# Patient Record
Sex: Female | Born: 1990 | Hispanic: Yes | Marital: Single | Smoking: Never smoker
Health system: Southern US, Community
[De-identification: ages and names within clinical notes are randomized; demographics above are authoritative.]

## PROBLEM LIST (undated history)

## (undated) ENCOUNTER — Inpatient Hospital Stay (HOSPITAL_COMMUNITY): Payer: Self-pay

## (undated) HISTORY — PX: BREAST REDUCTION SURGERY: SHX8

---

## 2015-09-25 ENCOUNTER — Encounter (HOSPITAL_BASED_OUTPATIENT_CLINIC_OR_DEPARTMENT_OTHER): Payer: Self-pay | Admitting: *Deleted

## 2015-09-25 ENCOUNTER — Emergency Department (HOSPITAL_BASED_OUTPATIENT_CLINIC_OR_DEPARTMENT_OTHER)
Admission: EM | Admit: 2015-09-25 | Discharge: 2015-09-25 | Disposition: A | Discharge status: Home / Self Care | Payer: PRIVATE HEALTH INSURANCE | Attending: Emergency Medicine | Admitting: Emergency Medicine

## 2015-09-25 ENCOUNTER — Emergency Department (HOSPITAL_BASED_OUTPATIENT_CLINIC_OR_DEPARTMENT_OTHER): Payer: PRIVATE HEALTH INSURANCE

## 2015-09-25 DIAGNOSIS — R1030 Lower abdominal pain, unspecified: Secondary | ICD-10-CM | POA: Insufficient documentation

## 2015-09-25 DIAGNOSIS — O9989 Other specified diseases and conditions complicating pregnancy, childbirth and the puerperium: Secondary | ICD-10-CM | POA: Diagnosis present

## 2015-09-25 DIAGNOSIS — R109 Unspecified abdominal pain: Secondary | ICD-10-CM

## 2015-09-25 DIAGNOSIS — O26899 Other specified pregnancy related conditions, unspecified trimester: Secondary | ICD-10-CM

## 2015-09-25 DIAGNOSIS — Z3A01 Less than 8 weeks gestation of pregnancy: Secondary | ICD-10-CM | POA: Insufficient documentation

## 2015-09-25 LAB — URINALYSIS, ROUTINE W REFLEX MICROSCOPIC
Bilirubin Urine: NEGATIVE
Glucose, UA: NEGATIVE mg/dL
HGB URINE DIPSTICK: NEGATIVE
KETONES UR: NEGATIVE mg/dL
LEUKOCYTES UA: NEGATIVE
Nitrite: NEGATIVE
PROTEIN: NEGATIVE mg/dL
Specific Gravity, Urine: 1.021 (ref 1.005–1.030)
UROBILINOGEN UA: 0.2 mg/dL (ref 0.0–1.0)
pH: 7 (ref 5.0–8.0)

## 2015-09-25 LAB — WET PREP, GENITAL
TRICH WET PREP: NONE SEEN
Yeast Wet Prep HPF POC: NONE SEEN

## 2015-09-25 LAB — PREGNANCY, URINE: Preg Test, Ur: POSITIVE — AB

## 2015-09-25 LAB — HCG, QUANTITATIVE, PREGNANCY: hCG, Beta Chain, Quant, S: 4600 m[IU]/mL — ABNORMAL HIGH (ref <5)

## 2015-09-25 NOTE — ED Notes (Addendum)
Pt c/o of lower abdominal pain x1day. Pt is pregnant, states 6 weeks. States pain feels like pressure, comes and goes. Tender to touch. Denies bleeding.

## 2015-09-25 NOTE — Discharge Instructions (Signed)
You are 5 weeks and 1 day pregnant.  Please follow up with OBGYN in 10-14 days for a formal ultrasound.    Dolor abdominal en el embarazo (Abdominal Pain During Pregnancy) El dolor abdominal es frecuente durante el embarazo. Generalmente no causa ningn dao. El dolor abdominal puede tener numerosas causas. Algunas causas son ms graves que otras. Ciertas causas de dolor abdominal durante el embarazo se diagnostican fcilmente. A veces, se tarda un tiempo para llegar al diagnstico. Otras veces la causa no se conoce. El dolor abdominal puede estar relacionado con [place] alteracin del [place], o puede deberse a una causa totalmente diferente. Por este motivo, siempre consulte a su mdico cuando sienta molestias abdominales. INSTRUCCIONES PARA EL CUIDADO EN EL HOGAR  Est atenta al dolor para ver si hay cambios. Las siguientes indicaciones ayudarn a Psychologist, educational [employer] pueda sentir:  No Chiropodist sexuales y no coloque nada dentro de la vagina hasta que los sntomas hayan desaparecido completamente.  Descanse todo lo que pueda [employer] dolor se le haya calmado.  Si siente nuseas, beba lquidos claros. Evite los alimentos slidos mientras sienta malestar o tenga nuseas.  Tome slo medicamentos de venta libre o recetados, segn las indicaciones del mdico.  Cumpla con todas las visitas de control, segn le indique su mdico. SOLICITE ATENCIN MDICA DE INMEDIATO SI:  Tiene un sangrado, prdida de lquidos o elimina tejidos por la vagina.  El dolor o los clicos [place].  Tiene vmitos persistentes.  Comienza a Financial risk analyst al orinar u [employer].  Tiene fiebre.  Nota que los movimientos del beb disminuyen.  Siente intensa debilidad o se marea.  Tiene dificultad para respirar con o sin dolor abdominal.  Siente un dolor de cabeza intenso junto al dolor abdominal.  S[place]le [place] secrecin vaginal anormal con dolor abdominal.  Tiene diarrea  persistente.  El dolor abdominal sigue o empeora an despus de Field seismologist. ASEGRESE DE QUE:   Comprende estas instrucciones.  Controlar su afeccin.  Recibir ayuda de inmediato si no mejora o si empeora. Document Released: 12/15/2005 Document Revised: 10/05/2013 [facility] Patient Information 2015 [place], [place]. This information is not intended to replace advice given to you by your health care provider. Make sure you discuss any questions you have with your health care provider.

## 2015-09-25 NOTE — ED Notes (Addendum)
Abdominal cramps. States she is [redacted] weeks pregnant. Diarrhea yesterday.

## 2015-09-25 NOTE — ED Provider Notes (Signed)
CSN: [id number]     Arrival date & time 09/25/15  1535 History   First MD Initiated Contact with Patient 09/25/15 1603     Chief Complaint  Patient presents with  . Abdominal Pain     (Consider location/radiation/quality/duration/timing/severity/associated sxs/prior Treatment) HPI   24 year old G1P0 female presenting for evaluation of abdominal pain. patient believes that she is [redacted] weeks pregnant after having 3 positive pregnancy test at home. Her last menstrual period was 08/15/2015. She has not had a formal ultrasound to confirm her pregnancy. This morning while playing with a child in the gymnastics gym she developed mild crampy lower abdominal pain which lasted for approximately 1 hour and has resolved without any specific treatment. Rates her pain as 6 out of 10. She denies having any fever, chills, dizziness, lightheadedness, chest pain, difficulty breathing, back pain, dysuria, hematuria, vaginal bleeding, vaginal discharge, nausea vomiting diarrhea headache. She does have history of UTI and report mild odorous urine for the past few days. This is an unplanned pregnancy. She denies alcohol abuse or smoking.       History reviewed. No pertinent past medical history. History reviewed. No pertinent past surgical history. No family history on file. Social History  Substance Use Topics  . Smoking status: Never Smoker   . Smokeless tobacco: None  . Alcohol Use: No   OB History    Gravida Para Term Preterm AB TAB SAB Ectopic Multiple Living   1              Review of Systems  All other systems reviewed and are negative.      Allergies  Review of patient's allergies indicates no known allergies.  Home Medications   Prior to Admission medications   Not on File   BP 125/72 mmHg  Pulse 72  Temp(Src) 98.7 F (37.1 C) (Oral)  Resp 20  Wt 121 lb 4 oz (54.999 kg)  SpO2 100%  LMP 08/15/2015 Physical Exam  Constitutional: She appears well-developed and well-nourished. No  distress.  HENT:  Head: Atraumatic.  Eyes: Conjunctivae are normal.  Neck: Neck supple.  Cardiovascular: Normal rate and regular rhythm.   Pulmonary/Chest: Effort normal and breath sounds normal.  Abdominal: Soft. Bowel sounds are normal. She exhibits no distension. There is no tenderness.  Nongravid abdomen.  Genitourinary:  Chaperone present during exam.  No inguinal lymphadenopathy or inguinal hernia noted. Normal external genitalia. No discomfort with speculum insertion. Normal vaginal vault with mild functional white discharge. Close cervical os with mild friable tissue but no significant bleeding. On bimanual examination no adnexal tenderness or cervical motion tenderness. No mass noted.  Neurological: She is alert.  Skin: No rash noted.  Psychiatric: She has a normal mood and affect.  Nursing note and vitals reviewed.   ED Course  Procedures (including critical care time)  Patient presents for low abdominal pain. She is currently pregnant. Her pain started after strenuous activities. Pain is likely muscular skeletal. No significant discomfort on pelvic examination and abdomen is nontender on exam. Given her complaint, workup initiated including OB transvaginal ultrasound to confirm intrauterine pregnancy.  9:33 PM Ultrasound demonstrate an intrauterine pregnancy approximately 5 weeks and 1 day. It is recommended for patient to have a follow-up ultrasound in 10-14 days in which patient agrees. Otherwise her labs are reassuring. Return precautions discussed. Patient stable for discharge.  Labs Review Labs Reviewed  WET PREP, GENITAL - Abnormal; Notable for the following:    Clue Cells Wet Prep HPF POC FEW (*)  WBC, Wet Prep HPF POC FEW (*)    All other components within normal limits  PREGNANCY, URINE - Abnormal; Notable for the following:    Preg Test, Ur POSITIVE (*)    All other components within normal limits  HCG, QUANTITATIVE, PREGNANCY - Abnormal; Notable for the  following:    hCG, Beta Chain, Quant, S 4600 (*)    All other components within normal limits  URINALYSIS, ROUTINE W REFLEX MICROSCOPIC (NOT AT [facility])  RPR  HIV ANTIBODY (ROUTINE TESTING)  GC/CHLAMYDIA PROBE AMP ([facility]) NOT AT [facility]    Imaging Review US Ob Comp Less 14 Wks  09/25/2015   CLINICAL DATA:  Pelvic cramping  EXAM: OBSTETRIC <14 WK [place] AND TRANSVAGINAL OB US  TECHNIQUE: Both transabdominal and transvaginal ultrasound examinations were performed for complete evaluation of the gestation as well as the maternal uterus, adnexal regions, and pelvic cul-de-sac. Transvaginal technique was performed to assess early pregnancy.  COMPARISON:  None.  FINDINGS: Intrauterine gestational sac: Visualized/normal in shape.  Yolk sac:  Visualized  Embryo:  Not visualized  Cardiac Activity: Not visualized  MSD: 4  mm   5 w   1  d  Maternal uterus/adnexae: There is no demonstrable subchorionic hemorrhage. There is a uterine leiomyoma measuring 1.2 x 1.1 x 1.1 cm in the posterior fundal region. There is no subchorionic hemorrhage. Cervical os is closed. There is no extrauterine pelvic or adnexal mass. No free pelvic fluid.  IMPRESSION: Intrauterine gestational sac and yolk sac seen. Fetal pole not yet seen. Based on gestational sac size, estimated gestational age is 5 weeks. Given the current circumstance, follow-up ultrasound in 10-14 days advised. Small intrauterine leiomyoma. No extrauterine pelvic or adnexal mass. No free pelvic fluid.   Electronically Signed   By: Bretta Bang III M.D.   On: 09/25/2015 21:06   US Ob Transvaginal  09/25/2015   CLINICAL DATA:  Pelvic cramping  EXAM: OBSTETRIC <14 WK [place] AND TRANSVAGINAL OB US  TECHNIQUE: Both transabdominal and transvaginal ultrasound examinations were performed for complete evaluation of the gestation as well as the maternal uterus, adnexal regions, and pelvic cul-de-sac. Transvaginal technique was performed to assess early pregnancy.  COMPARISON:   None.  FINDINGS: Intrauterine gestational sac: Visualized/normal in shape.  Yolk sac:  Visualized  Embryo:  Not visualized  Cardiac Activity: Not visualized  MSD: 4  mm   5 w   1  d  Maternal uterus/adnexae: There is no demonstrable subchorionic hemorrhage. There is a uterine leiomyoma measuring 1.2 x 1.1 x 1.1 cm in the posterior fundal region. There is no subchorionic hemorrhage. Cervical os is closed. There is no extrauterine pelvic or adnexal mass. No free pelvic fluid.  IMPRESSION: Intrauterine gestational sac and yolk sac seen. Fetal pole not yet seen. Based on gestational sac size, estimated gestational age is 5 weeks. Given the current circumstance, follow-up ultrasound in 10-14 days advised. Small intrauterine leiomyoma. No extrauterine pelvic or adnexal mass. No free pelvic fluid.   Electronically Signed   By: Bretta Bang III M.D.   On: 09/25/2015 21:06   I have personally reviewed and evaluated these images and lab results as part of my medical decision-making.   EKG Interpretation None      MDM   Final diagnoses:  Abdominal pain in pregnancy    BP 114/75 mmHg  Pulse 69  Temp(Src) 98.7 F (37.1 C) (Oral)  Resp 18  Wt 121 lb 4 oz (54.999 kg)  SpO2 100%  LMP 08/15/2015  Fayrene Helper, PA-C 09/25/15 2134  Lavera Guise, MD 09/26/15 [phone]

## 2015-09-26 LAB — HIV ANTIBODY (ROUTINE TESTING W REFLEX): HIV SCREEN 4TH GENERATION: NONREACTIVE

## 2015-09-26 LAB — GC/CHLAMYDIA PROBE AMP (~~LOC~~) NOT AT ARMC
Chlamydia: NEGATIVE
Neisseria Gonorrhea: NEGATIVE

## 2015-09-26 LAB — SYPHILIS: RPR W/REFLEX TO RPR TITER AND TREPONEMAL ANTIBODIES, TRADITIONAL SCREENING AND DIAGNOSIS ALGORITHM: RPR Ser Ql: NONREACTIVE

## 2015-10-02 ENCOUNTER — Inpatient Hospital Stay (HOSPITAL_COMMUNITY): Payer: PRIVATE HEALTH INSURANCE

## 2015-10-02 ENCOUNTER — Encounter (HOSPITAL_COMMUNITY): Payer: Self-pay | Admitting: *Deleted

## 2015-10-02 ENCOUNTER — Inpatient Hospital Stay (HOSPITAL_COMMUNITY)
Admission: AD | Admit: 2015-10-02 | Discharge: 2015-10-02 | Disposition: A | Discharge status: Home / Self Care | Payer: PRIVATE HEALTH INSURANCE | Source: Ambulatory Visit | Attending: Obstetrics & Gynecology | Admitting: Obstetrics & Gynecology

## 2015-10-02 DIAGNOSIS — Z3A01 Less than 8 weeks gestation of pregnancy: Secondary | ICD-10-CM | POA: Insufficient documentation

## 2015-10-02 DIAGNOSIS — R109 Unspecified abdominal pain: Secondary | ICD-10-CM | POA: Diagnosis present

## 2015-10-02 DIAGNOSIS — O209 Hemorrhage in early pregnancy, unspecified: Secondary | ICD-10-CM | POA: Diagnosis not present

## 2015-10-02 DIAGNOSIS — O034 Incomplete spontaneous abortion without complication: Secondary | ICD-10-CM

## 2015-10-02 DIAGNOSIS — N939 Abnormal uterine and vaginal bleeding, unspecified: Secondary | ICD-10-CM | POA: Diagnosis present

## 2015-10-02 LAB — CBC
HEMATOCRIT: 37.3 % (ref 36.0–46.0)
Hemoglobin: 12.6 g/dL (ref 12.0–15.0)
MCH: 30 pg (ref 26.0–34.0)
MCHC: 33.8 g/dL (ref 30.0–36.0)
MCV: 88.8 fL (ref 78.0–100.0)
Platelets: 335 10*3/uL (ref 150–400)
RBC: 4.2 MIL/uL (ref 3.87–5.11)
RDW: 12.4 % (ref 11.5–15.5)
WBC: 9.9 10*3/uL (ref 4.0–10.5)

## 2015-10-02 LAB — URINALYSIS, ROUTINE W REFLEX MICROSCOPIC
Bilirubin Urine: NEGATIVE
Glucose, UA: NEGATIVE mg/dL
Ketones, ur: NEGATIVE mg/dL
LEUKOCYTES UA: NEGATIVE
Nitrite: NEGATIVE
PROTEIN: NEGATIVE mg/dL
Specific Gravity, Urine: 1.02 (ref 1.005–1.030)
Urobilinogen, UA: 0.2 mg/dL (ref 0.0–1.0)
pH: 7.5 (ref 5.0–8.0)

## 2015-10-02 LAB — URINE MICROSCOPIC-ADD ON

## 2015-10-02 LAB — HCG, QUANTITATIVE, PREGNANCY: hCG, Beta Chain, Quant, S: 3417 m[IU]/mL — ABNORMAL HIGH (ref <5)

## 2015-10-02 LAB — ABO/RH: ABO/RH(D): O POS

## 2015-10-02 MED ORDER — MISOPROSTOL 200 MCG PO TABS
800.0000 ug | ORAL_TABLET | Freq: Once | ORAL | Status: AC
  Administered 2015-10-02: 800 ug via VAGINAL
  Filled 2015-10-02: qty 4

## 2015-10-02 MED ORDER — OXYCODONE-ACETAMINOPHEN 5-325 MG PO TABS
1.0000 | ORAL_TABLET | Freq: Once | ORAL | Status: AC
  Administered 2015-10-02: 1 via ORAL
  Filled 2015-10-02: qty 1

## 2015-10-02 MED ORDER — PROMETHAZINE HCL 25 MG PO TABS: 25.0000 mg | ORAL_TABLET | Freq: Four times a day (QID) | ORAL | Status: AC | PRN

## 2015-10-02 MED ORDER — OXYCODONE-ACETAMINOPHEN 5-325 MG PO TABS: 1.0000 | ORAL_TABLET | ORAL | Status: AC | PRN

## 2015-10-02 NOTE — Progress Notes (Signed)
Written and verbal d/c instructions given and understanding voiced. FOB is coming to get pt. Pt alittle dizzy from Percocet so will remain in room until he arrives

## 2015-10-02 NOTE — MAU Provider Note (Signed)
Chief Complaint: Vaginal Bleeding and Abdominal Pain  First Provider Initiated Contact with Patient 10/02/2015 at 1235.  SUBJECTIVE HPI: Nichole Yang is a 24 y.o. G1P0 at [redacted]w[redacted]d by LMP who presents to [facility] reporting vaginal bleeding as heavy as a period and low abdominal cramping since this morning. Passing tiny clots. Was seen in ED 09/25/2015 for low abdominal pain in pregnancy. Ultrasound showed gestational sac with yolk sac, but no fetal pole. Quantitative hCG was 4600. Pelvic exam done.  Location: Suprapubic area Quality: Cramping Severity: 7/10 on pain scale Duration: 4 hours Context: none Timing: intermittent Modifying factors: none. Hasn't tried anything for pain Associated signs and symptoms: vaginal bleeding.   History reviewed. No pertinent past medical history. OB History  Gravida Para Term Preterm AB SAB TAB Ectopic Multiple Living  1             # Outcome Date GA Lbr Len/2nd Weight Sex Delivery Anes PTL Lv  1 Current              Past Surgical History  Procedure Laterality Date  . Breast reduction surgery     Social History   Social History  . Marital Status: Single    Spouse Name: N/A  . Number of Children: N/A  . Years of Education: N/A   Occupational History  . Not on file.   Social History Main Topics  . Smoking status: Never Smoker   . Smokeless tobacco: Not on file  . Alcohol Use: No  . Drug Use: No  . Sexual Activity: Yes   Other Topics Concern  . Not on file   Social History Narrative   No current facility-administered medications on file prior to encounter.   No current outpatient prescriptions on file prior to encounter.   No Known Allergies  I have reviewed the past Medical Hx, Surgical Hx, Social Hx, Allergies and Medications.   Review of Systems  Constitutional: Negative for fever and chills.  Gastrointestinal: Positive for abdominal pain. Negative for nausea, vomiting, diarrhea and constipation.   Genitourinary: Positive for vaginal bleeding. Negative for dysuria, urgency, frequency, hematuria, flank pain and vaginal discharge.  Musculoskeletal: Negative for back pain.  Neurological: Negative for dizziness.    OBJECTIVE Patient Vitals for the past 24 hrs:  BP Temp Temp src Pulse Resp  10/02/15 1449 119/69 mmHg 98.3 F (36.8 C) Oral 87 18  10/02/15 1228 118/73 mmHg 98.7 F (37.1 C) - 81 18   Constitutional: Well-developed, well-nourished female in no acute distress.  Cardiovascular: normal rate Respiratory: normal rate and effort.  GI: Abd soft, non-tender, gravid appropriate for gestational age. Pos BS x 4 MS: Extremities nontender, no edema, normal ROM Neurologic: Alert and oriented x 4.  GU: Neg CVAT.  SPECULUM EXAM: Declined due to recent exam. Small -mod amount of BRB on pad.   LAB RESULTS Results for orders placed or performed during the hospital encounter of 10/02/15 (from the past 24 hour(s))  Urinalysis, Routine w reflex microscopic (not at [facility])     Status: Abnormal   Collection Time: 10/02/15 12:20 PM  Result Value Ref Range   Color, Urine YELLOW YELLOW   APPearance CLEAR CLEAR   Specific Gravity, Urine 1.020 1.005 - 1.030   pH 7.5 5.0 - 8.0   Glucose, UA NEGATIVE NEGATIVE mg/dL   Hgb urine dipstick LARGE (A) NEGATIVE   Bilirubin Urine NEGATIVE NEGATIVE   Ketones, ur NEGATIVE NEGATIVE mg/dL   Protein, ur NEGATIVE NEGATIVE mg/dL   Urobilinogen,  UA 0.2 0.0 - 1.0 mg/dL   Nitrite NEGATIVE NEGATIVE   Leukocytes, UA NEGATIVE NEGATIVE  Urine microscopic-add on     Status: None   Collection Time: 10/02/15 12:20 PM  Result Value Ref Range   Squamous Epithelial / LPF RARE RARE   RBC / HPF 0-2 <3 RBC/hpf   Bacteria, UA RARE RARE  ABO/Rh     Status: None   Collection Time: 10/02/15  1:14 PM  Result Value Ref Range   ABO/RH(D) O POS   CBC     Status: None   Collection Time: 10/02/15  1:14 PM  Result Value Ref Range   WBC 9.9 4.0 - 10.5 K/uL   RBC 4.20 3.87 -  5.11 MIL/uL   Hemoglobin 12.6 12.0 - 15.0 g/dL   HCT 16.1 09.6 - 04.5 %   MCV 88.8 78.0 - 100.0 fL   MCH 30.0 26.0 - 34.0 pg   MCHC 33.8 30.0 - 36.0 g/dL   RDW 40.9 81.1 - 91.4 %   Platelets 335 150 - 400 K/uL  hCG, quantitative, pregnancy     Status: Abnormal   Collection Time: 10/02/15  1:14 PM  Result Value Ref Range   hCG, Beta Chain, Quant, S 3417 (H) <5 mIU/mL    IMAGING US Ob Comp Less 14 Wks  09/25/2015   CLINICAL DATA:  Pelvic cramping  EXAM: OBSTETRIC <14 WK [place] AND TRANSVAGINAL OB US  TECHNIQUE: Both transabdominal and transvaginal ultrasound examinations were performed for complete evaluation of the gestation as well as the maternal uterus, adnexal regions, and pelvic cul-de-sac. Transvaginal technique was performed to assess early pregnancy.  COMPARISON:  None.  FINDINGS: Intrauterine gestational sac: Visualized/normal in shape.  Yolk sac:  Visualized  Embryo:  Not visualized  Cardiac Activity: Not visualized  MSD: 4  mm   5 w   1  d  Maternal uterus/adnexae: There is no demonstrable subchorionic hemorrhage. There is a uterine leiomyoma measuring 1.2 x 1.1 x 1.1 cm in the posterior fundal region. There is no subchorionic hemorrhage. Cervical os is closed. There is no extrauterine pelvic or adnexal mass. No free pelvic fluid.  IMPRESSION: Intrauterine gestational sac and yolk sac seen. Fetal pole not yet seen. Based on gestational sac size, estimated gestational age is 5 weeks. Given the current circumstance, follow-up ultrasound in 10-14 days advised. Small intrauterine leiomyoma. No extrauterine pelvic or adnexal mass. No free pelvic fluid.   Electronically Signed   By: Bretta Bang III M.D.   On: 09/25/2015 21:06   US Ob Transvaginal  10/02/2015   CLINICAL DATA:  Bleeding. Pending quantitative beta HCG. LMP 08/15/2015. EDC by LMP is 05/21/2016. By LMP patient is 6 weeks 6 days. Followup.  EXAM: TRANSVAGINAL OB ULTRASOUND  TECHNIQUE: Transvaginal ultrasound was performed for  complete evaluation of the gestation as well as the maternal uterus, adnexal regions, and pelvic cul-de-sac.  COMPARISON:  09/25/2015  FINDINGS: Intrauterine gestational sac: Not seen  Yolk sac:  Not seen  Embryo:  Not seen  Cardiac Activity: Not seen  Maternal uterus/adnexae: The ovaries have a normal appearance. Endometrial stripe is slightly heterogeneous. There is focal thickening of the endometrium in the mid uterine body, measuring 1.2 cm. No free pelvic fluid. Small fundal fibroid is 1.2 x 0.9 x 0.9 cm.  IMPRESSION: 1. No intrauterine gestational sac identified at this time. 2. Focally thickened and heterogeneous endometrium suggests possible retained products of conception.   Electronically Signed   By: Norva Pavlov M.D.  On: 10/02/2015 14:02   US Ob Transvaginal  09/25/2015   CLINICAL DATA:  Pelvic cramping  EXAM: OBSTETRIC <14 WK [place] AND TRANSVAGINAL OB US  TECHNIQUE: Both transabdominal and transvaginal ultrasound examinations were performed for complete evaluation of the gestation as well as the maternal uterus, adnexal regions, and pelvic cul-de-sac. Transvaginal technique was performed to assess early pregnancy.  COMPARISON:  None.  FINDINGS: Intrauterine gestational sac: Visualized/normal in shape.  Yolk sac:  Visualized  Embryo:  Not visualized  Cardiac Activity: Not visualized  MSD: 4  mm   5 w   1  d  Maternal uterus/adnexae: There is no demonstrable subchorionic hemorrhage. There is a uterine leiomyoma measuring 1.2 x 1.1 x 1.1 cm in the posterior fundal region. There is no subchorionic hemorrhage. Cervical os is closed. There is no extrauterine pelvic or adnexal mass. No free pelvic fluid.  IMPRESSION: Intrauterine gestational sac and yolk sac seen. Fetal pole not yet seen. Based on gestational sac size, estimated gestational age is 5 weeks. Given the current circumstance, follow-up ultrasound in 10-14 days advised. Small intrauterine leiomyoma. No extrauterine pelvic or adnexal mass. No  free pelvic fluid.   Electronically Signed   By: Bretta Bang III M.D.   On: 09/25/2015 21:06    MAU COURSE [place], Quant. CBC, ABO/Rh  Passed small amount of tissue. Bleeding stable.   Discussed [place] and Quant Dx incomplete AB. Recommend expectant management. Pt has flight scheduled for 10/07/15 back home to [place]. Wonders if it is safe to travel. CNM expressed concern about possibility of hemorrhage or infection during travel if all tissue has not passed. Offered Cytotec. Patient gives consent.       Early Intrauterine Pregnancy Failure  _x__  Documented intrauterine pregnancy failure less than or equal to [redacted] weeks gestation  _x__  No serious current illness  _x__  Baseline Hgb greater than or equal to 10g/dl  _x__  Patient has easily accessible transportation to the hospital  _x__  Clear preference  _x__  Practitioner/physician deems patient reliable  _x__  Counseling by practitioner or physician  _x__  Patient education by RN  _NA_  Consent form signed  _NA_  Rho-Gam given by RN if indicated  _x__ Medication dispensed   _x__   Cytotec 800 mcg  __   Intravaginally by patient at home         _x_   Intravaginally by RN in MAU        __   Rectally by patient at home        __   Rectally by RN in MAU  ___  Ibuprofen 600 mg 1 tablet by mouth every 6 hours as needed #30  _x__  Oxycodone/acetaminophen 5/325 mg by mouth every 4 to 6 hours as needed  _x__  Phenergan 12.5 mg by mouth every 4 hours as needed for nausea   MDM  24 year old female 6 weeks 1 day by LMP diagnosed with incomplete AB. Bleeding stable. Cytotec given in MAU since patient has to travel in 5 days.   ASSESSMENT 1. Incomplete miscarriage   2. Vaginal bleeding in pregnancy, first trimester    PLAN Discharge home in stable condition. Bleeding Precautions Support given. Offered chaplain.  Work note given. Release of information signed and records given for patient to have during  travel. Follow-up Information    Follow up with [facility] [place] [facility]ERN[facility] ADMISS[facility].   Why:  As needed in emergencies (heavy bleeding, severe pain or fever  greater than 100.4)   Contact information:   [address] [id number] mc [place] [address] [phone]       Medication List    TAKE these medications        oxyCODONE-acetaminophen 5-325 MG tablet  Commonly known as:  PERCOCET/ROXICET  Take 1-2 tablets by mouth every 4 (four) hours as needed.     prenatal multivitamin Tabs tablet  Take 1 tablet by mouth daily at 12 noon.     promethazine 25 MG tablet  Commonly known as:  PHENERGAN  Take 1 tablet (25 mg total) by mouth every 6 (six) hours as needed for nausea or vomiting.       [place], CNM 10/02/2015  4:11 PM

## 2015-10-02 NOTE — MAU Note (Addendum)
Saw vag bleeding and cramping since 0930. Saw alittle blood last night. Bright blood. No recent intercourse. Had diarrhea this am and then saw blood

## 2015-10-02 NOTE — Progress Notes (Signed)
Nichole Yang CNM discussed test results and d/c plan with pt in length. Pt signed release and has lab and u/s results to have with her while traveling to [place]. Written and verbal d/c instructions given and understanding voiced.

## 2015-10-02 NOTE — Discharge Instructions (Signed)
Aborto incompleto °(Incomplete Miscarriage) °Un aborto espontáneo es la pérdida repentina de un bebé en gestación (feto) antes de la semana 20 del embarazo. En un aborto espontáneo, partes del feto o la placenta (alumbramiento) permanecen en el cuerpo.  °El aborto espontáneo puede ser una experiencia que afecte emocionalmente a la persona. Hable con su médico si tiene preguntas sobre el aborto espontáneo, el proceso de duelo y los planes futuros de embarazo. °CAUSAS  °· Algunos problemas cromosómicos pueden hacer imposible que el bebé se desarrolle normalmente. Los problemas con los genes o cromosomas del bebé son, en la mayoría de los casos, el resultado de errores que se producen, al azar, cuando el embrión se divide y crece. Estos problemas no se heredan de los padres. °· Infección en el cuello del útero. °· Problemas hormonales. °· Problemas en el cuello del útero, como tener un útero incompetente. Esto ocurre cuando los tejidos no son lo suficientemente fuertes como para contener el embarazo. °· Problemas del útero, como un útero con forma anormal, los fibromas o anormalidades congénitas. °· Ciertas enfermedades crónicas. °· No fume, no beba alcohol, ni consuma drogas. °· Traumatismos. °SÍNTOMAS  °· Sangrado o manchado vaginal, con o sin cólicos o dolor. °· Dolor o cólicos en el abdomen o en la cintura. °· Eliminación de líquido, tejidos o coágulos grandes por la vagina. °DIAGNÓSTICO  °El médico le hará un examen físico. También le indicará una ecografía para confirmar el aborto. Es posible que se realicen análisis de sangre. °TRATAMIENTO  °· Generalmente se realiza un procedimiento de dilatación y curetaje (D y C). Durante el procedimiento de dilatación y curetaje, el cuello del útero se abre (dilata) y se retira todo resto de tejido fetal o placentario del útero. °· Si hay una infección, le recetarán antibióticos. Posiblemente le receten otros medicamentos para reducir (contraer) el tamaño del útero si hay  mucha hemorragia. °· Si su tipo de sangre es Rh negativo y el del bebé es Rh positivo, necesitará una inyección de inmunoglobulina Rho(D). Esta inyección protegerá a los futuros bebés de tener problemas de compatibilidad Rh en futuros embarazos. °· Probablemente le indiquen reposo. Esto significa que debe quedarse en cama y levantarse únicamente para ir al baño. °INSTRUCCIONES PARA EL CUIDADO EN EL HOGAR  °· Haga reposo según las indicaciones del médico. °· Limite las actividades según las indicaciones del médico. Es posible que se le permita retomar las actividades livianas si no se le realizó un curetaje, pero necesitará tratamiento adicional. °· Lleve un registro de la cantidad de toallas sanitarias que usa por día. Observe cuán impregnadas (saturadas) están. Registre esta información. °· No  use tampones. °· No se haga duchas vaginales ni tenga relaciones sexuales hasta que el médico la autorice. °· Asista a todas las citas de seguimiento para una nueva evaluación y para continuar el tratamiento. °· Sólo tome medicamentos de venta libre o recetados para calmar el dolor, el malestar o bajar la fiebre, según las indicaciones de su médico. °· Tome los antibióticos como le indicó el médico. Asegúrese de que finaliza la prescripción completa aunque se sienta mejor. °SOLICITE ATENCIÓN MÉDICA DE INMEDIATO SI:  °· Siente calambres intensos en el estómago, en la espalda o en el abdomen. °· Le sube la fiebre sin motivo (asegúrese de registrar las cifras). °· Elimina coágulos grandes o tejidos (consérvelos para que el médico los analice). °· La hemorragia aumenta. °· Se siente mareada, débil o tiene episodios de desmayo. °ASEGÚRESE DE QUE:  °· Comprende estas instrucciones. °·   Controlar su afeccin.  Recibir ayuda de inmediato si no mejora o si empeora. Document Released: 12/15/2005 Document Revised: 10/05/2013 [facility] Patient Information 2015 [place] Manor[place]Care, [place]. This information is not intended to replace advice given  to you by your health care provider. Make sure you discuss any questions you have with your health care provider.  FACTS YOU SHOULD KNOW  WHAT IS AN EARLY PREGNANCY FAILURE? Once the egg is fertilized with the sperm and begins to develop, it attaches to the lining of the uterus. This early pregnancy tissue may not develop into an embryo (the beginning stage of a baby). Sometimes an embryo does develop but does not continue to grow. These problems can be seen on ultrasound.   MANAGEMNT OF EARLY PREGNANCY FAILURE: About 4 out of 100 (0.25%) women will have a pregnancy loss in her lifetime.  One in five pregnancies is found to be an early pregnancy failure.  There are 3 ways to care for an early pregnancy failure:   (1) Surgery, (2) Medicine, (3) Waiting for you to pass the pregnancy on your own. The decision as to how to proceed after being diagnosed with and early pregnancy failure is an individual one.  The decision can be made only after appropriate counseling.  You need to weigh the pros and cons of the 3 choices. Then you can make the choice that works for you. SURGERY (D&E)  Procedure over in 1 day  Requires being put to sleep  Bleeding may be light  Possible problems during surgery, including injury to womb(uterus)  Care provider has more control Medicine (CYTOTEC)  The complete procedure may take days to weeks  No Surgery  Bleeding may be heavy at times  There may be drug side effects  Patient has more control Waiting  You may choose to wait, in which case your own body may complete the passing of the abnormal early pregnancy on its own in about 2-4 weeks  Your bleeding may be heavy at times  There is a small possibility that you may need surgery if the bleeding is too much or not all of the pregnancy has passed. CYTOTEC MANAGEMENT Prostaglandins (cytotec) are the most widely used drug for this purpose. They cause the uterus to cramp and contract. You will place the  medicine yourself inside your vagina in the privacy of your home. Empting of the uterus should occur within 3 days but the process may continue for several weeks. The bleeding may seem heavy at times. POSSIBLE SIDE EFFECTS FROM CYTOTEC  Nausea   Vomiting  Diarrhea Fever  Chills  Hot Flashes Side effects  from the process of the early pregnancy failure include:  Cramping  Bleeding  Headaches  Dizziness RISKS: This is a low risk procedure. Less than 1 in 100 women has a complication. An incomplete passage of the early pregnancy may occur. Also, Hemorrhage (heavy bleeding) could happen.  Rarely the pregnancy will not be passed completely. Excessively heavy bleeding may occur.  Your doctor may need to perform surgery to empty the uterus (D&E). Afterwards: Everybody will feel differently after the early pregnancy completion. You may have soreness or cramps for a day or two. You may have soreness or cramps for day or two.  You may have light bleeding for up to 2 weeks. You may be as active as you feel like being. If you have any of the following problems you may call Maternity Admissions Unit at [phone].  If you have pain that does not  get better  with pain medication  Bleeding that soaks through 2 thick full-sized sanitary pads in an hour  Cramps that last longer than 2 days  Foul smelling discharge  Fever above 100.4 degrees F Even if you do not have any of these symptoms, you should have a follow-up exam to make sure you are healing properly. This appointment will be made for you before you leave the hospital. Your next normal period will start again in 4-6 week after the loss. You can get pregnant soon after the loss, so use birth control right away. Finally: Make sure all your questions are answered before during and after any procedure. Follow up with medical care and family planning methods.

## 2015-10-02 NOTE — MAU Note (Signed)
Offered pt inhouse spanish interpreter but she states she does not need her but will ask if wants one

## 2015-10-03 NOTE — Progress Notes (Signed)
Met with Ms. Nichole Yang a few hours after her loss. Her SO was bedside.  She stated how she felt fine this morning, was pregnant and now she is not. She said she just did not understand. She told me of her background and work assignment in [place] that was conditional that she not get pregnant. She expressed her concern for her stress (as a possible cause for losing her baby). She also said she argued with him (speaking of SO) a lot and didn't understand why she felt that way. I provided encouragement and tried to assure her this was not her fault - sometimes it just happens. She wanted to know what to expect and how she may feel emotionally over the next few days.  We talked about the classic stages of grief and that she may or may not have those feelings. She was concerned about not crying (at the time) even though she felt sadness. I encouraged her to contact [facility] if she feels differently before she leaves to go back to [place], [place] on Monday.  Chaplain Marlise Eves Holder   10/03/15 1300  Clinical Encounter Type  Visited With Patient and family together

## 2016-08-06 ENCOUNTER — Encounter (HOSPITAL_COMMUNITY): Payer: Self-pay

## 2016-09-12 IMAGING — US US OB TRANSVAGINAL
1 series · 15 of 28 positions shown · non-contrast
Comparison: 09/25/2015

CLINICAL DATA: Bleeding. Pending quantitative beta HCG. LMP
08/15/2015. EDC by LMP is 05/21/2016. By LMP patient is 6 weeks 6
days. Followup.

EXAM:
TRANSVAGINAL OB ULTRASOUND
TECHNIQUE: Transvaginal ultrasound was performed for complete evaluation of the
gestation as well as the maternal uterus, adnexal regions, and
pelvic cul-de-sac.

[Series 1: us ob transvaginal · 15 of 36 slices shown]
[im 1/36]
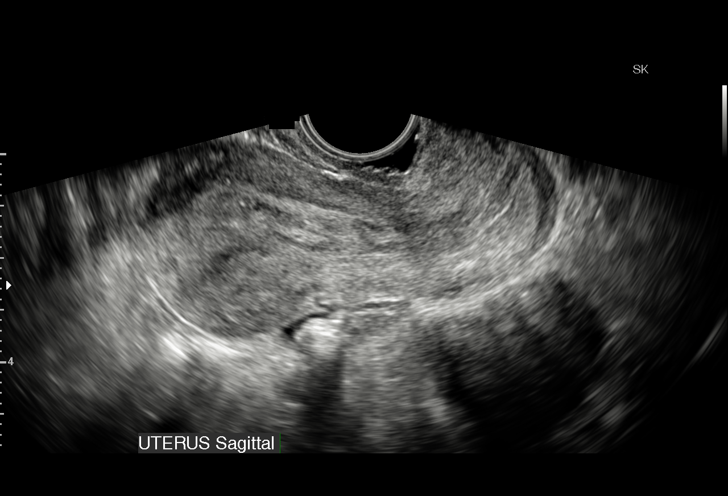
[im 3/36]
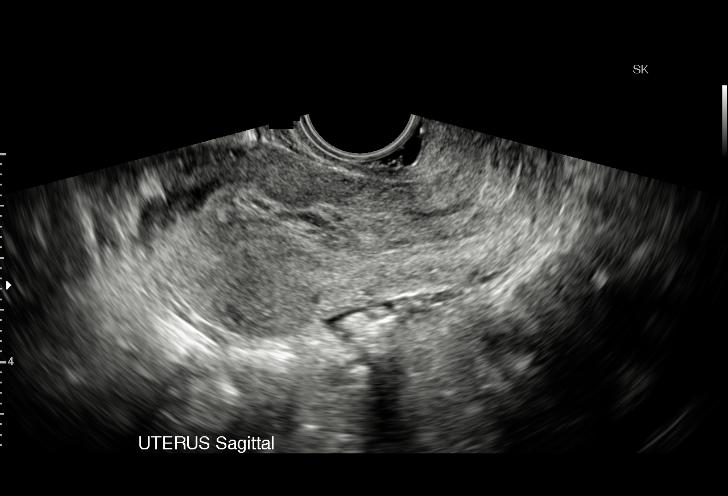
[im 6/36]
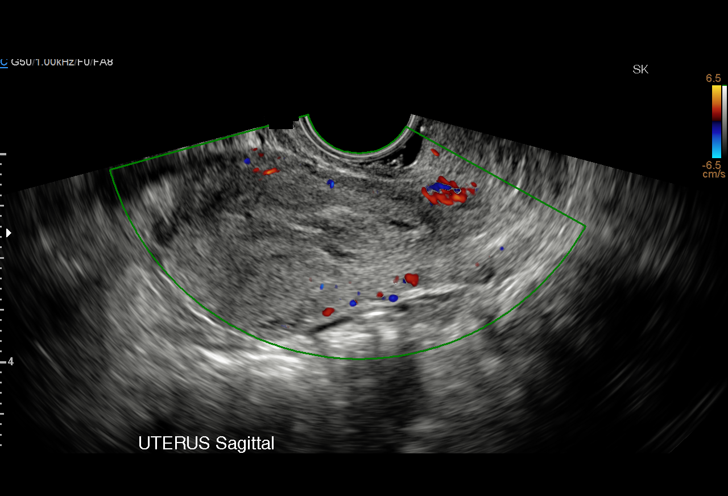
[im 8/36]
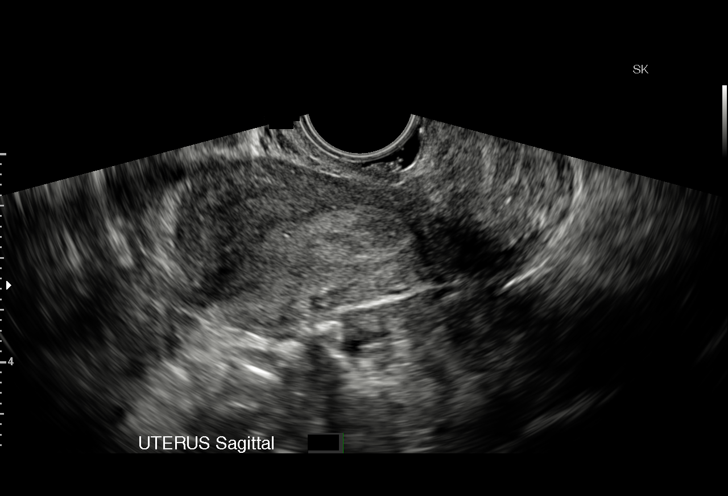
[im 11/36]
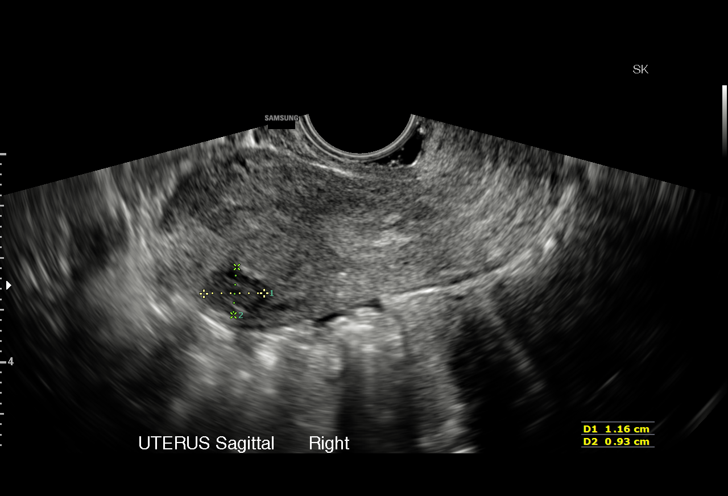
[im 13/36]
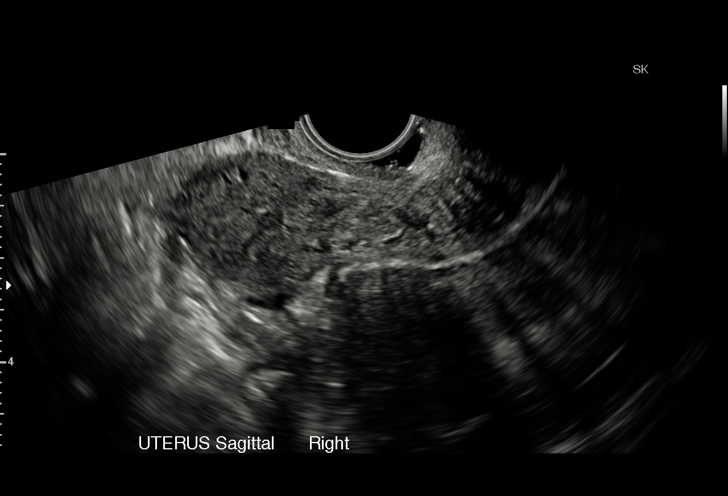
[im 16/36]
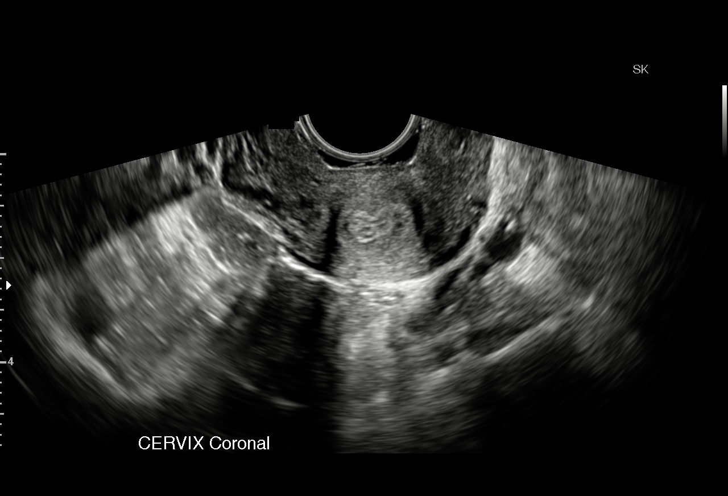
[im 19/36]
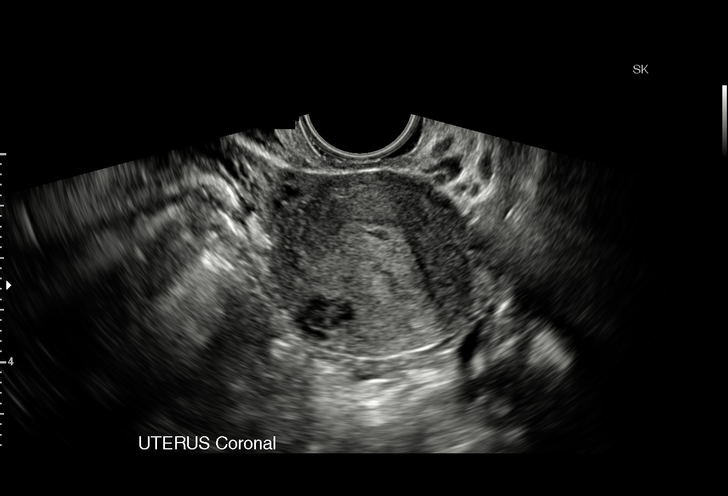
[im 20/36]
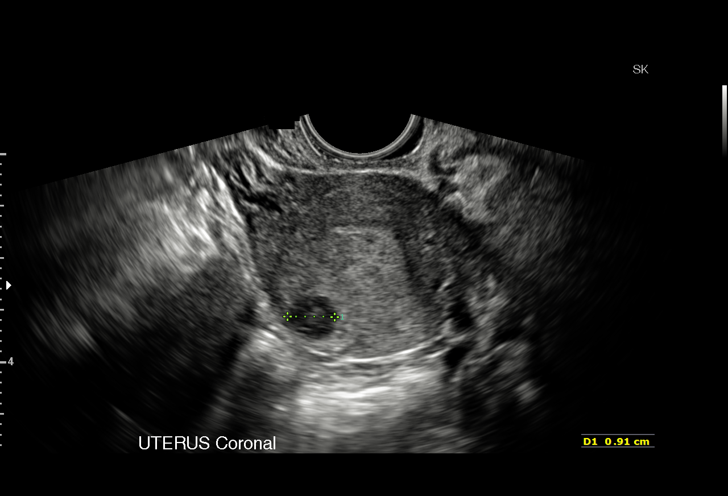
[im 23/36]
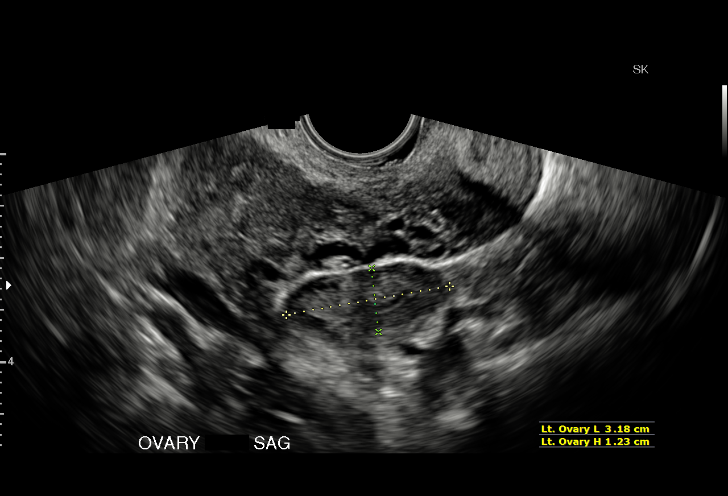
[im 25/36]
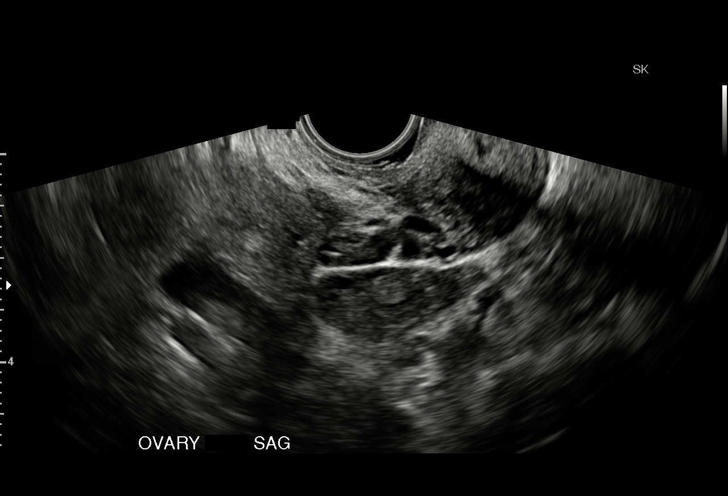
[im 28/36]
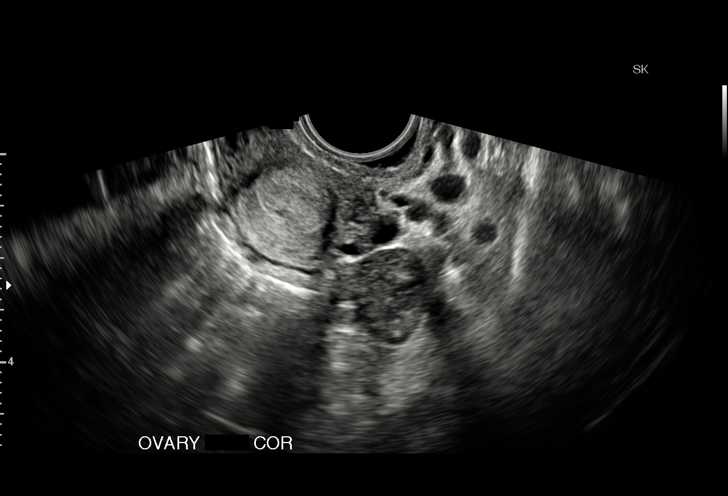
[im 30/36]
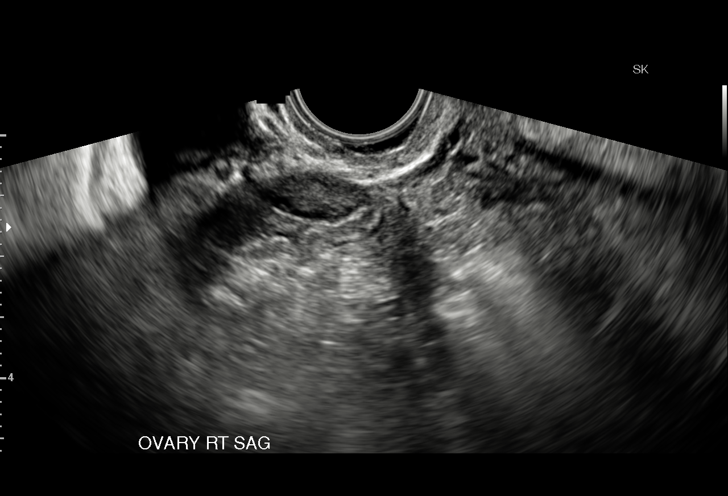
[im 33/36]
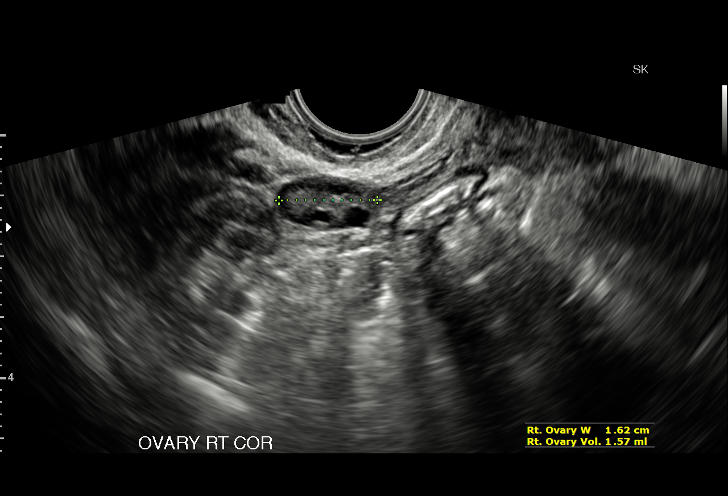
[im 36/36]
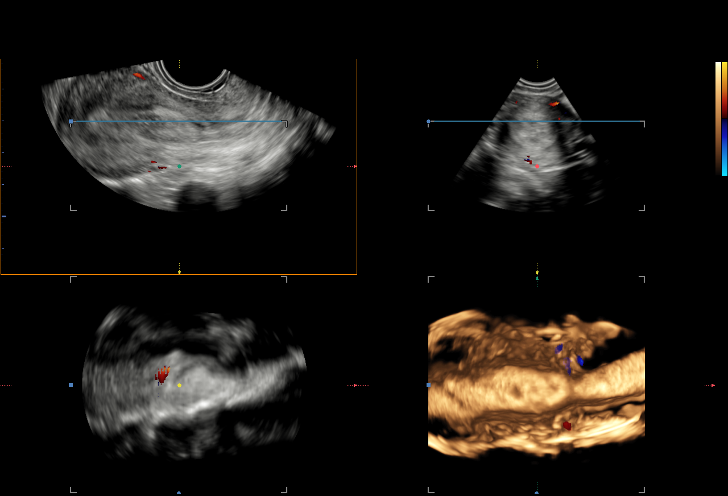

[15 of 28 positions shown; findings below may reference images not displayed]

FINDINGS: Intrauterine gestational sac: Not seen

Yolk sac:  Not seen

Embryo:  Not seen

Cardiac Activity: Not seen

Maternal uterus/adnexae: The ovaries have a normal appearance.
Endometrial stripe is slightly heterogeneous. There is focal
thickening of the endometrium in the mid uterine body, measuring
cm. No free pelvic fluid. Small fundal fibroid is 1.2 x 0.9 x
cm.
IMPRESSION: 1. No intrauterine gestational sac identified at this time.
2. Focally thickened and heterogeneous endometrium suggests possible
retained products of conception.

## 2017-05-24 IMAGING — US US OB TRANSVAGINAL
1 series · 14 of 28 positions shown · non-contrast
Comparison: None.

CLINICAL DATA: Pelvic cramping

EXAM:
OBSTETRIC <14 WK US AND TRANSVAGINAL OB US
TECHNIQUE: Both transabdominal and transvaginal ultrasound examinations were
performed for complete evaluation of the gestation as well as the
maternal uterus, adnexal regions, and pelvic cul-de-sac.
Transvaginal technique was performed to assess early pregnancy.

[Series 1: us ob transvaginal · 0.14mm/px · 14 of 36 slices shown]
[im 2/36]
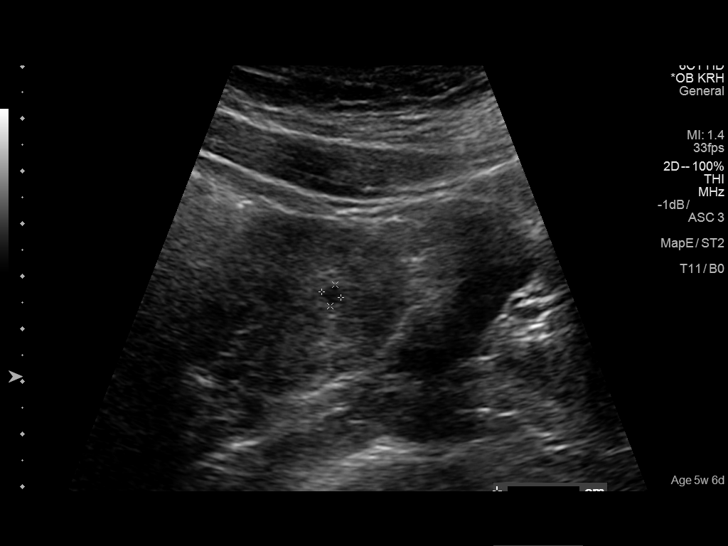
[im 4/36]
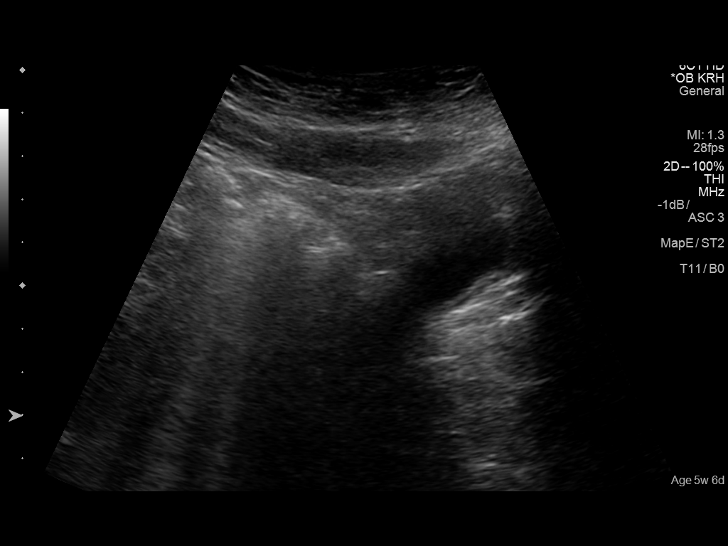
[im 7/36]
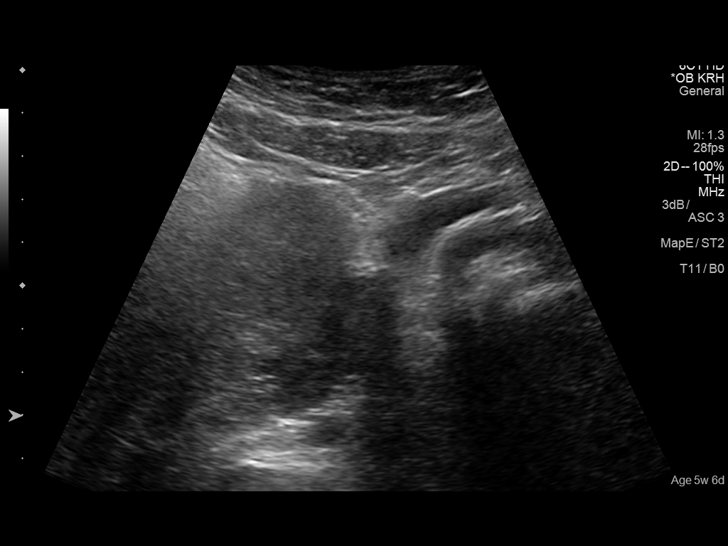
[im 10/36]
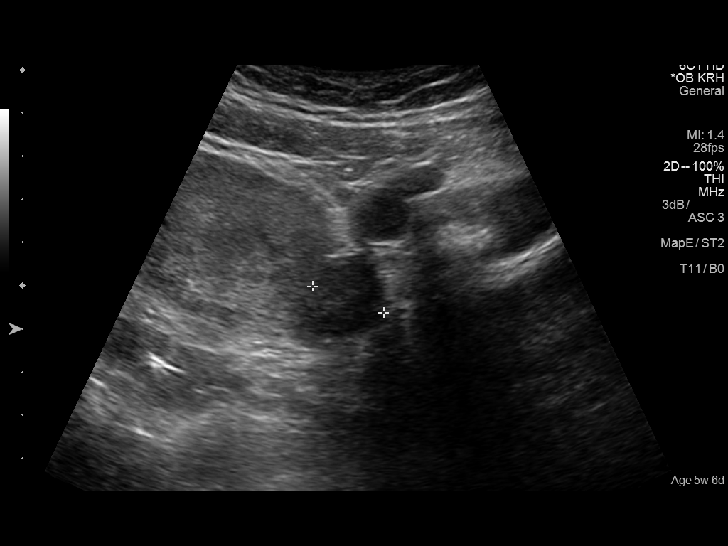
[im 12/36]
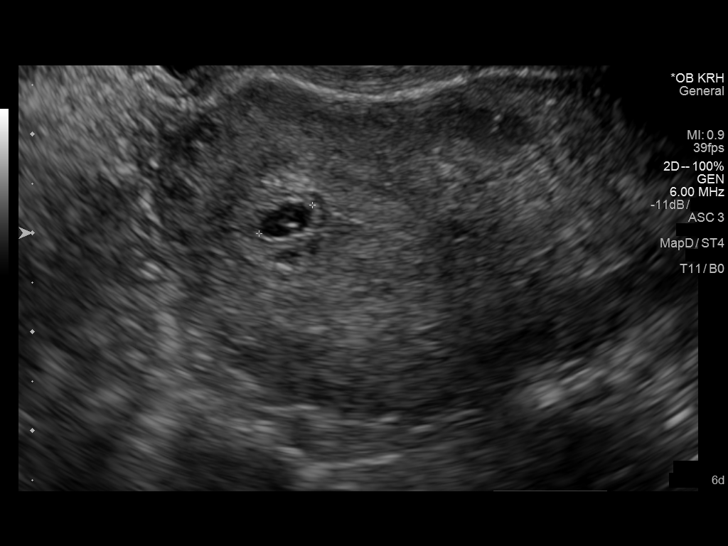
[im 15/36]
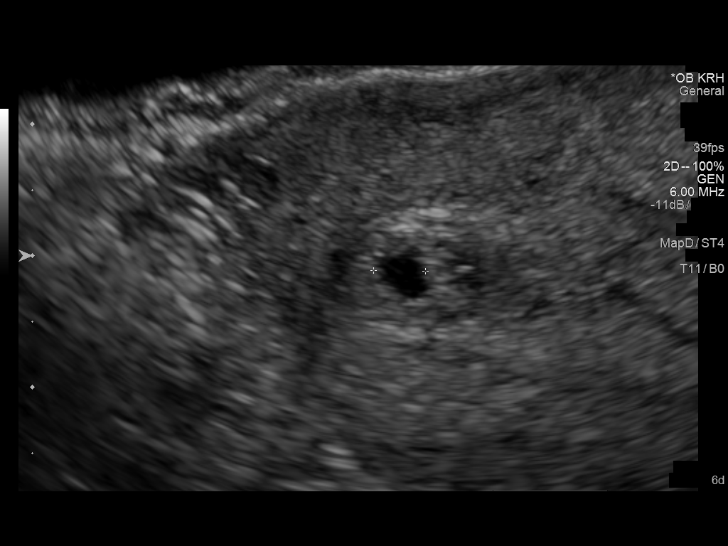
[im 17/36]
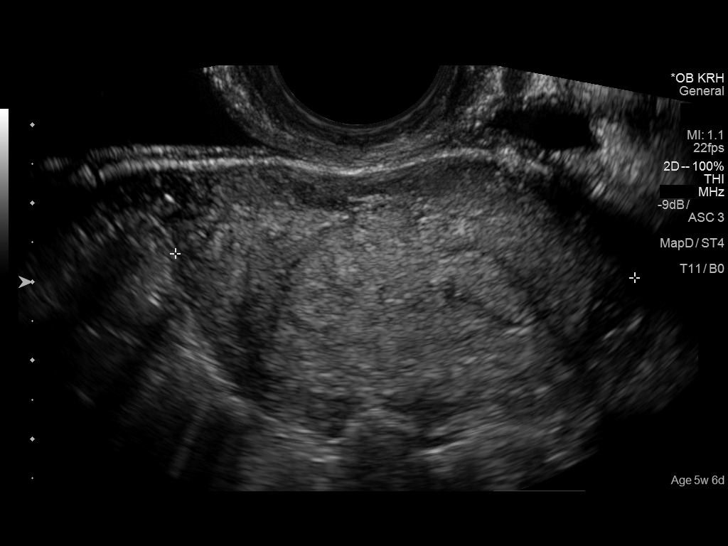
[im 20/36]
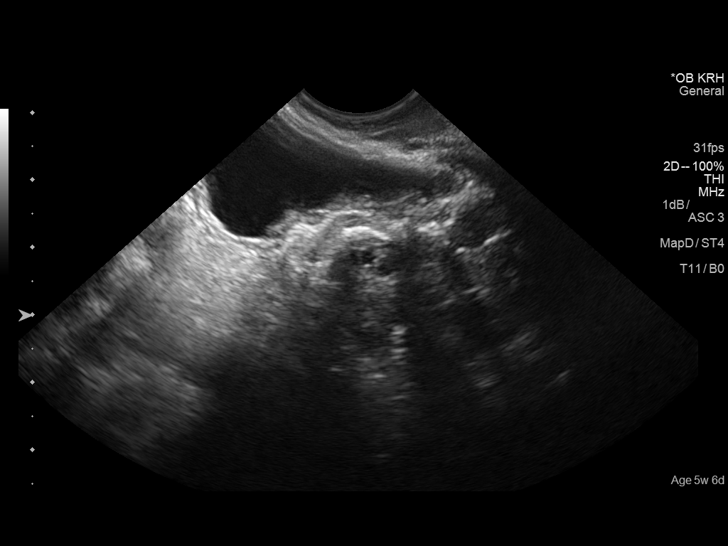
[im 23/36]
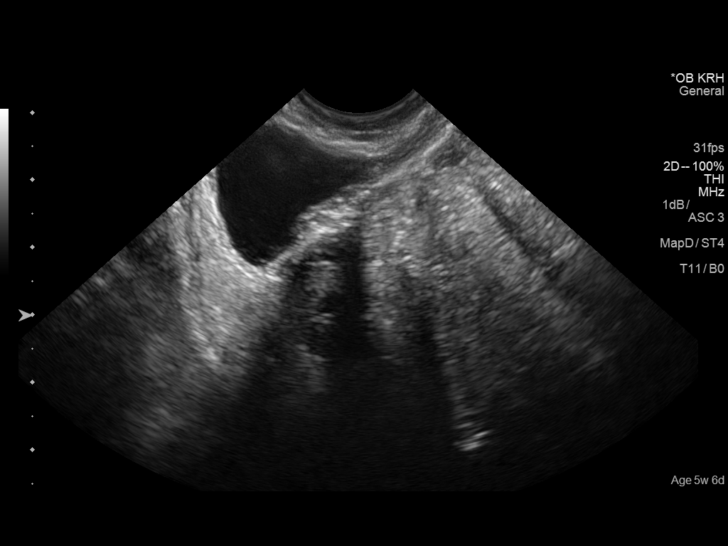
[im 25/36]
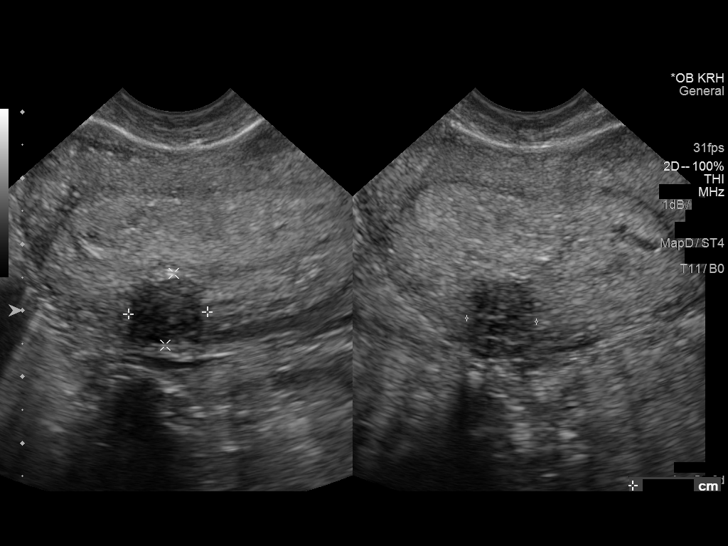
[im 28/36]
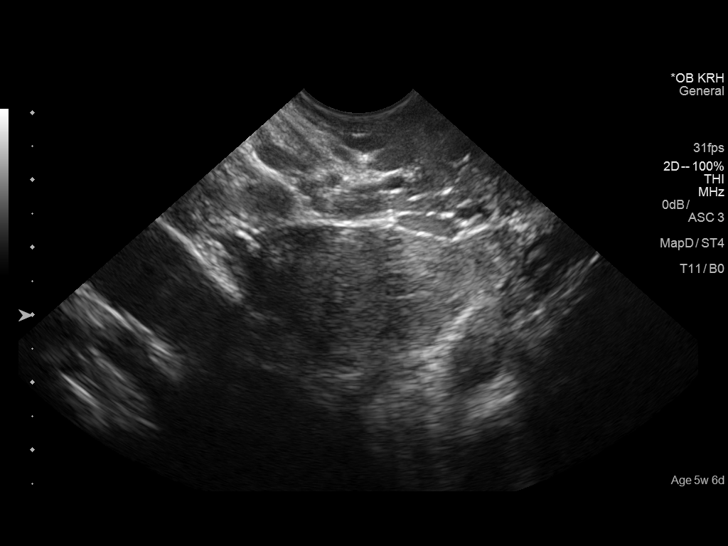
[im 30/36]
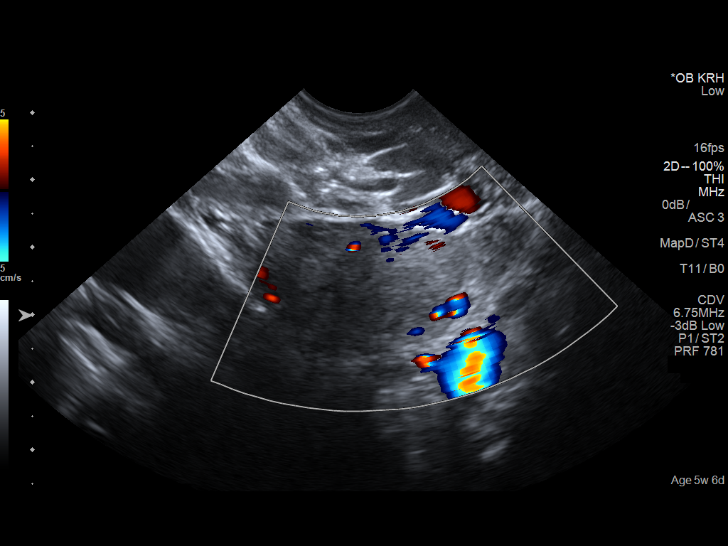
[im 33/36]
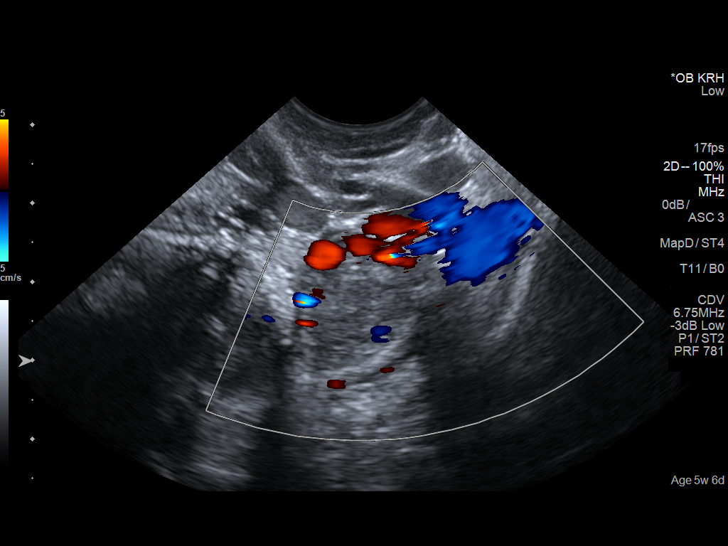
[im 36/36]
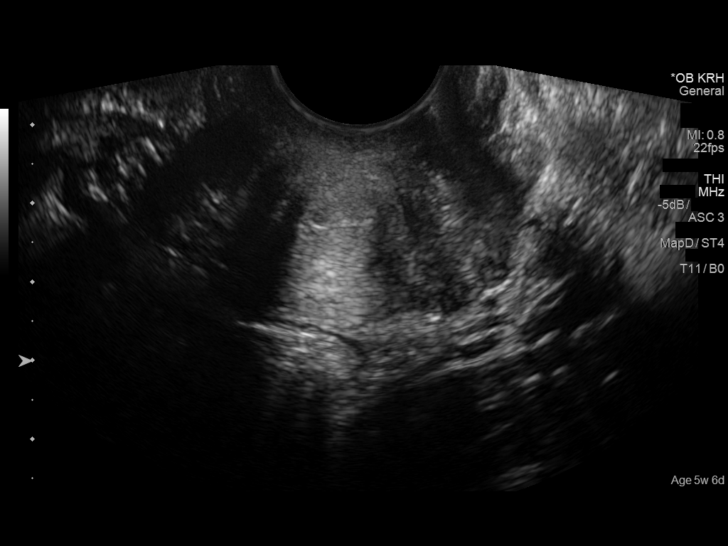

[14 of 28 positions shown; findings below may reference images not displayed]

FINDINGS: Intrauterine gestational sac: Visualized/normal in shape.

Yolk sac:  Visualized

Embryo:  Not visualized

Cardiac Activity: Not visualized

MSD: 4  mm   5 w   1  d

Maternal uterus/adnexae: There is no demonstrable subchorionic
hemorrhage. There is a uterine leiomyoma measuring 1.2 x 1.1 x
cm in the posterior fundal region. There is no subchorionic
hemorrhage. Cervical os is closed. There is no extrauterine pelvic
or adnexal mass. No free pelvic fluid.
IMPRESSION: Intrauterine gestational sac and yolk sac seen. Fetal pole not yet
seen. Based on gestational sac size, estimated gestational age is 5
weeks. Given the current circumstance, follow-up ultrasound in 10-14
days advised. Small intrauterine leiomyoma. No extrauterine pelvic
or adnexal mass. No free pelvic fluid.
